# Patient Record
Sex: Female | Born: 1955 | Race: White | Hispanic: No | Marital: Married | Smoking: Never smoker
Health system: Southern US, Community
[De-identification: ages and names within clinical notes are randomized; demographics above are authoritative.]

## PROBLEM LIST (undated history)

## (undated) DIAGNOSIS — C4490 Unspecified malignant neoplasm of skin, unspecified: Secondary | ICD-10-CM

## (undated) HISTORY — PX: BASAL CELL CARCINOMA EXCISION: SHX1214

## (undated) HISTORY — PX: APPENDECTOMY: SHX54

## (undated) HISTORY — DX: Unspecified malignant neoplasm of skin, unspecified: C44.90

## (undated) HISTORY — PX: CERVICAL DISC SURGERY: SHX588

---

## 2014-07-20 ENCOUNTER — Other Ambulatory Visit: Payer: Self-pay | Admitting: Orthopaedic Surgery

## 2014-07-20 DIAGNOSIS — M48061 Spinal stenosis, lumbar region without neurogenic claudication: Secondary | ICD-10-CM

## 2014-07-24 ENCOUNTER — Ambulatory Visit
Admission: RE | Admit: 2014-07-24 | Discharge: 2014-07-24 | Disposition: A | Discharge status: Home / Self Care | Payer: BC Managed Care – PPO | Source: Ambulatory Visit | Attending: Orthopaedic Surgery | Admitting: Orthopaedic Surgery

## 2014-07-24 DIAGNOSIS — M48061 Spinal stenosis, lumbar region without neurogenic claudication: Secondary | ICD-10-CM

## 2014-08-08 ENCOUNTER — Other Ambulatory Visit: Payer: Self-pay

## 2014-11-17 ENCOUNTER — Other Ambulatory Visit: Payer: Self-pay | Admitting: Orthopaedic Surgery

## 2014-11-17 DIAGNOSIS — R102 Pelvic and perineal pain: Secondary | ICD-10-CM

## 2014-11-30 ENCOUNTER — Ambulatory Visit
Admission: RE | Admit: 2014-11-30 | Discharge: 2014-11-30 | Disposition: A | Discharge status: Home / Self Care | Payer: BLUE CROSS/BLUE SHIELD | Source: Ambulatory Visit | Attending: Orthopaedic Surgery | Admitting: Orthopaedic Surgery

## 2014-11-30 DIAGNOSIS — R102 Pelvic and perineal pain: Secondary | ICD-10-CM

## 2014-11-30 MED ORDER — GADOBENATE DIMEGLUMINE 529 MG/ML IV SOLN
13.0000 mL | Freq: Once | INTRAVENOUS | Status: AC | PRN
  Administered 2014-11-30: 13 mL via INTRAVENOUS

## 2014-12-05 ENCOUNTER — Other Ambulatory Visit (HOSPITAL_COMMUNITY): Payer: Self-pay | Admitting: Family Medicine

## 2014-12-05 DIAGNOSIS — R1903 Right lower quadrant abdominal swelling, mass and lump: Secondary | ICD-10-CM

## 2014-12-11 ENCOUNTER — Ambulatory Visit (HOSPITAL_COMMUNITY)
Admission: RE | Admit: 2014-12-11 | Discharge: 2014-12-11 | Disposition: A | Discharge status: Home / Self Care | Payer: BLUE CROSS/BLUE SHIELD | Source: Ambulatory Visit | Attending: Family Medicine | Admitting: Family Medicine

## 2014-12-11 DIAGNOSIS — R1903 Right lower quadrant abdominal swelling, mass and lump: Secondary | ICD-10-CM | POA: Diagnosis not present

## 2014-12-11 LAB — GLUCOSE, CAPILLARY: Glucose-Capillary: 89 mg/dL (ref 70–99)

## 2014-12-11 MED ORDER — FLUDEOXYGLUCOSE F - 18 (FDG) INJECTION: 7.7000 | Freq: Once | INTRAVENOUS | Status: AC | PRN

## 2015-08-06 ENCOUNTER — Other Ambulatory Visit: Payer: Self-pay | Admitting: Orthopaedic Surgery

## 2015-08-06 DIAGNOSIS — M5136 Other intervertebral disc degeneration, lumbar region: Secondary | ICD-10-CM

## 2015-08-20 ENCOUNTER — Ambulatory Visit
Admission: RE | Admit: 2015-08-20 | Discharge: 2015-08-20 | Disposition: A | Discharge status: Home / Self Care | Payer: BLUE CROSS/BLUE SHIELD | Source: Ambulatory Visit | Attending: Orthopaedic Surgery | Admitting: Orthopaedic Surgery

## 2015-08-20 DIAGNOSIS — M5136 Other intervertebral disc degeneration, lumbar region: Secondary | ICD-10-CM

## 2016-01-28 DIAGNOSIS — L570 Actinic keratosis: Secondary | ICD-10-CM | POA: Diagnosis not present

## 2016-01-28 DIAGNOSIS — Z08 Encounter for follow-up examination after completed treatment for malignant neoplasm: Secondary | ICD-10-CM | POA: Diagnosis not present

## 2016-01-28 DIAGNOSIS — Z85828 Personal history of other malignant neoplasm of skin: Secondary | ICD-10-CM | POA: Diagnosis not present

## 2016-03-18 DIAGNOSIS — Z01419 Encounter for gynecological examination (general) (routine) without abnormal findings: Secondary | ICD-10-CM | POA: Diagnosis not present

## 2016-04-23 IMAGING — MR MR LUMBAR SPINE W/O CM
4 of 5 series · 21 of 48 positions shown · non-contrast
Comparison: 12/11/2014 PET-CT. 11/30/2014 pelvic MR. 07/24/2014 and
07/15/2011 lumbar spine MR.

CLINICAL DATA: 59-year-old female with pain and numbness right
buttock and leg for the past 2 years. Subsequent encounter.

EXAM:
MRI LUMBAR SPINE WITHOUT CONTRAST
TECHNIQUE: Multiplanar, multisequence MR imaging of the lumbar spine was
performed. No intravenous contrast was administered.

[Series 6: T2 · sagittal · 4.0mm · 0.73mm/px · 7 of 15 slices shown (1 of 2)]
[im 1/15]
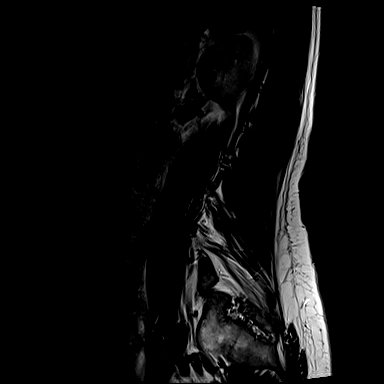
[im 3/15]
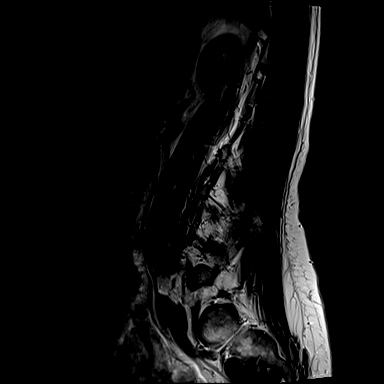
[im 5/15]
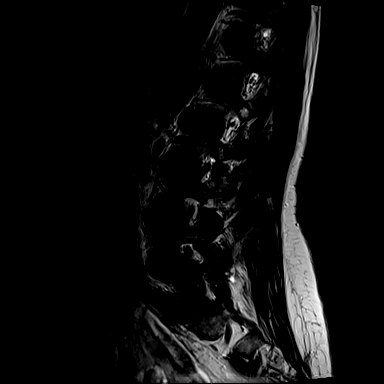
[im 8/15]
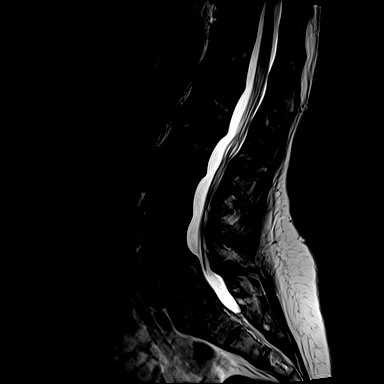
[im 10/15]
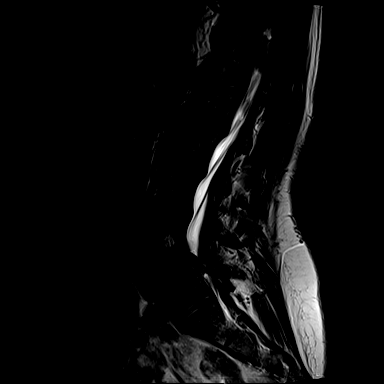
[im 12/15]
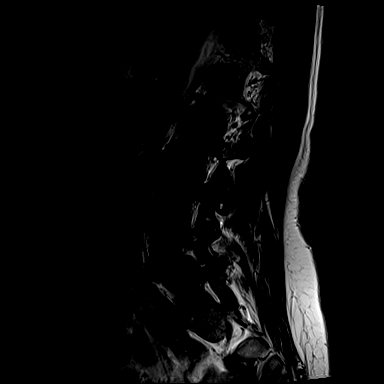
[im 15/15]
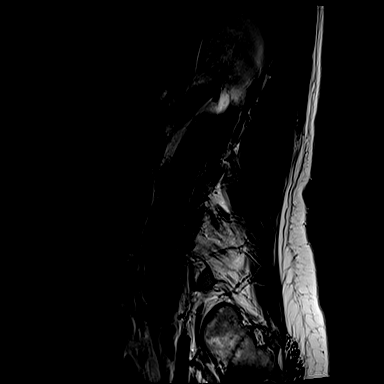

[Series 7: T1 · sagittal · 4.0mm · 0.73mm/px · 3 of 15 slices shown (1 of 2)]
[im 3/15]
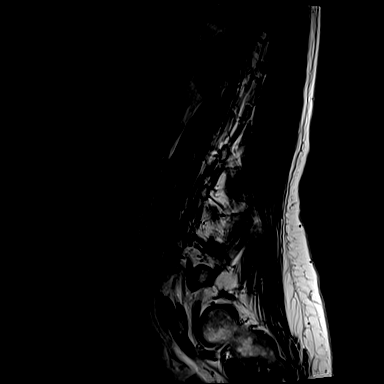
[im 8/15]
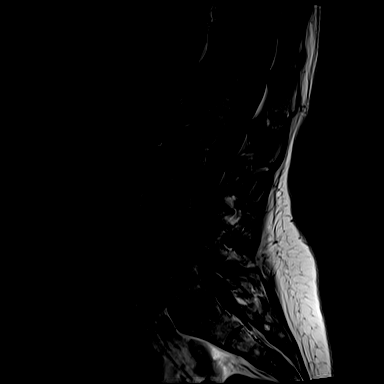
[im 12/15]
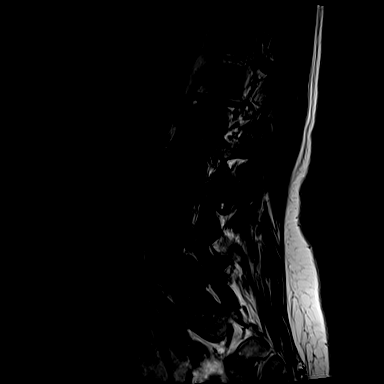

[Series 10: T2 · axial · 4.0mm · 0.28mm/px · z∈[-72,+115]mm · 8 of 33 slices shown (2 of 2)]
[im 1/33]
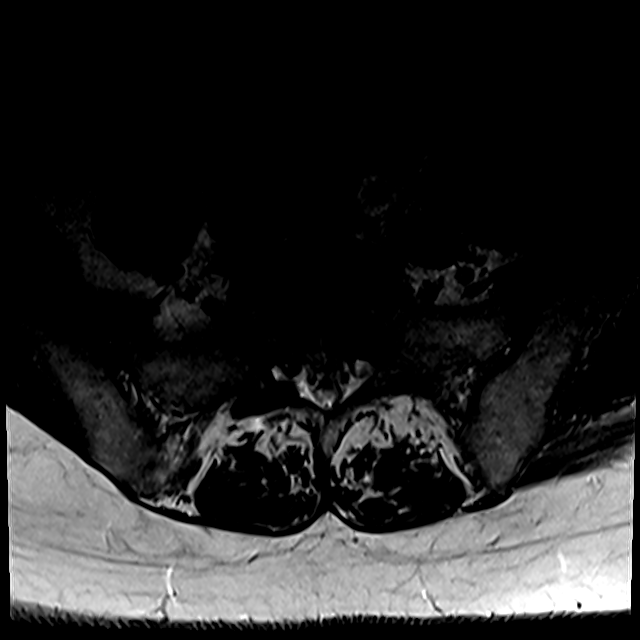
[im 5/33]
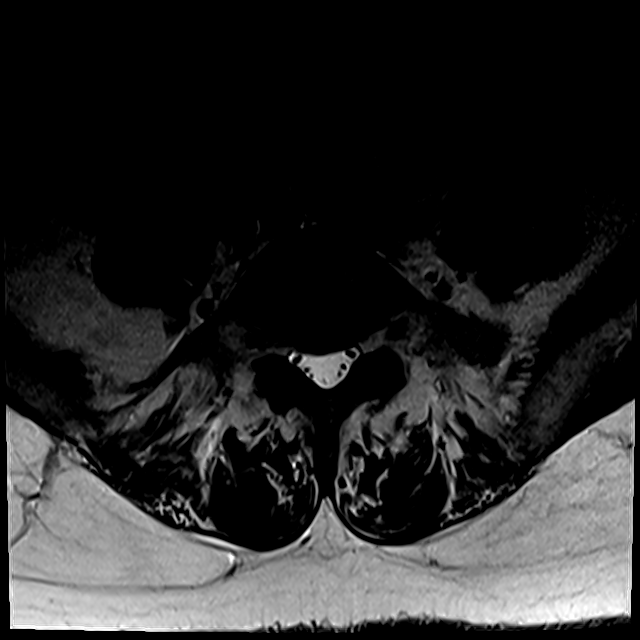
[im 10/33]
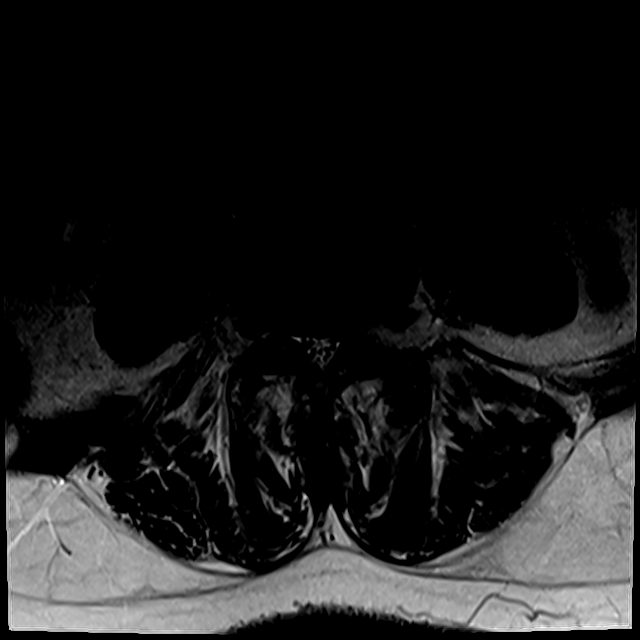
[im 15/33]
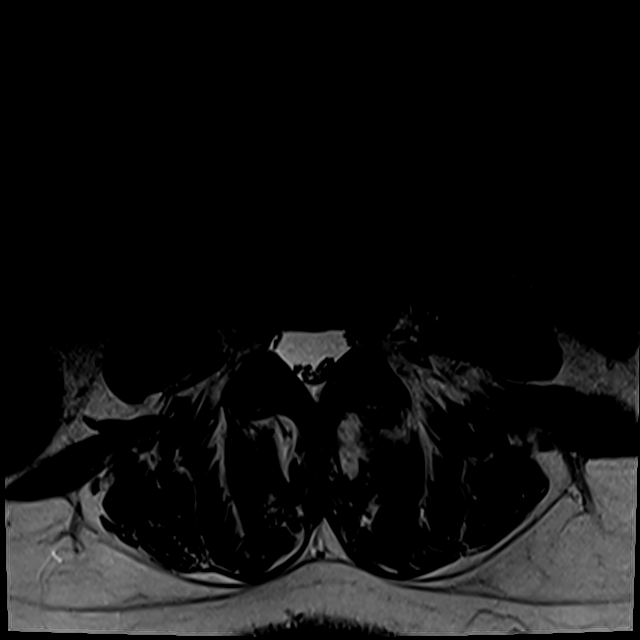
[im 18/33]
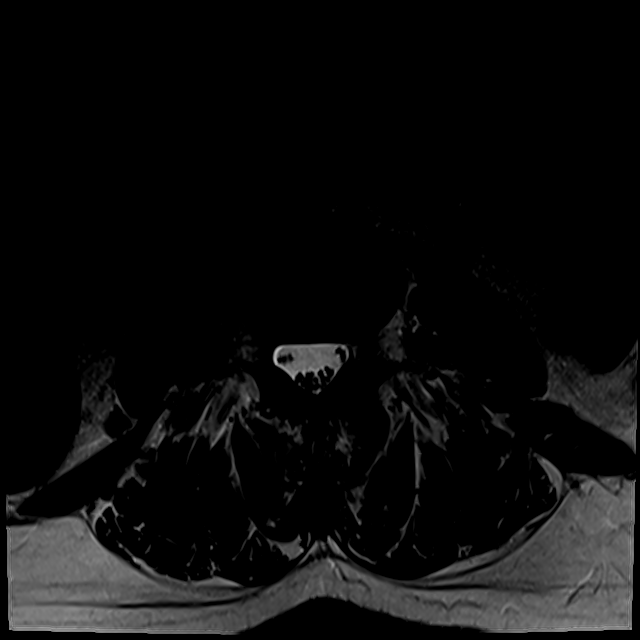
[im 23/33]
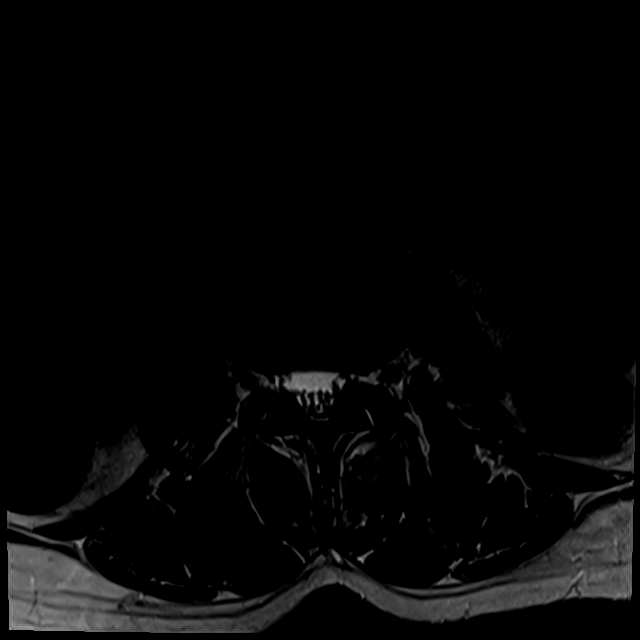
[im 28/33]
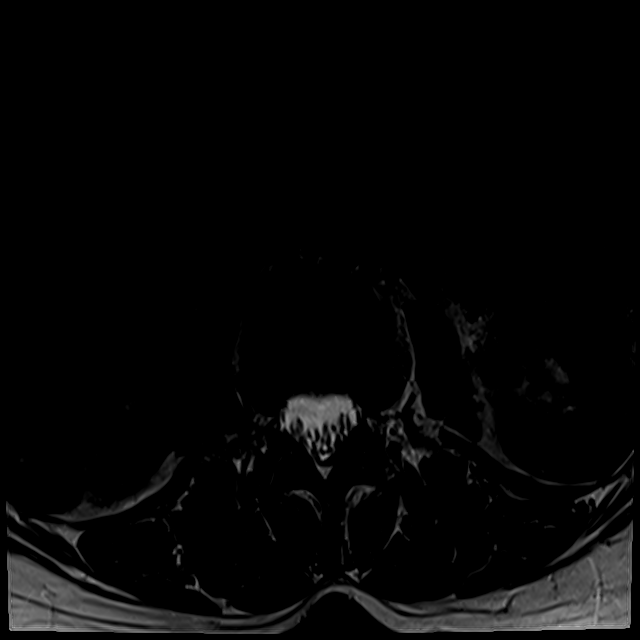
[im 33/33]
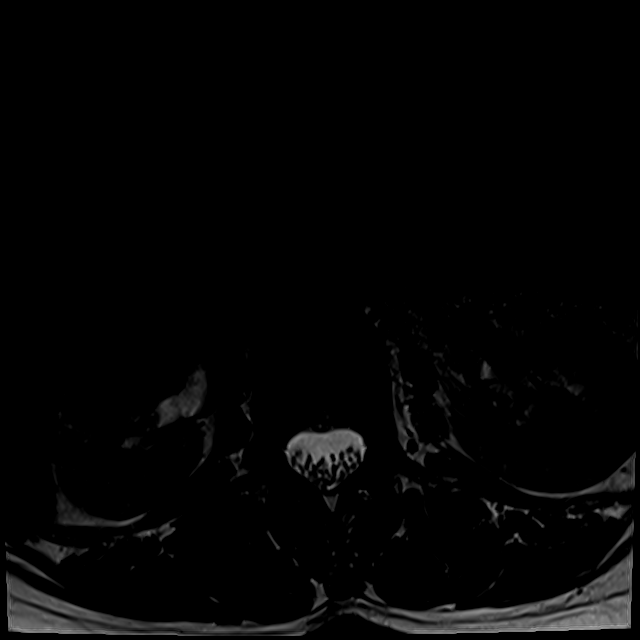

[Series 13: T1 · axial · 4.0mm · 0.56mm/px · z∈[-52,+90]mm · 3 of 33 slices shown (2 of 2)]
[im 5/33]
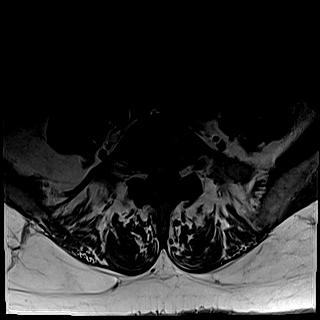
[im 18/33]
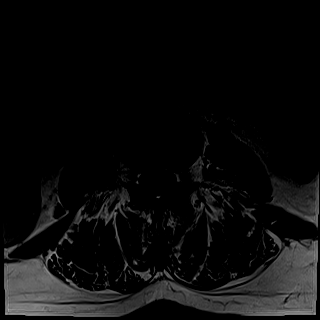
[im 28/33]
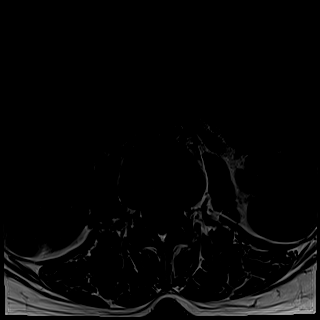

[21 of 48 positions shown; findings below may reference images not displayed]

FINDINGS: Last fully open disk space is labeled L5-S1. Present examination
incorporates from T11 through the upper S4 level. Conus lower T12
level.

S3 indeterminate osseous lesion minimally changed from pelvic MR and
07/24/2014 lumbar spine MR although new when compared to the
07/15/2011 MR. Kunze level radiotracer uptake in this region on PET-CT
and appearing lytic on CT.

1 cm rounded lesion right aspect L4 vertebral body, possibly a small
hemangioma is unchanged.

Gallstones suspected.

T11-12: Negative.

T12-L1:  Small Schmorl's node deformity.  Very mild bulge.

L1-2: Bulge slightly greater right paracentral position. Slight
indentation ventral thecal sac.

L2-3: Moderate facet joint degenerative changes. Minimal
retrolisthesis L2. Mild bulge. Very mild spinal stenosis. Minimal
left foraminal narrowing.

L3-4: Mild to moderate facet joint degenerative change greater on
left. Minimal bulge.

L4-5: Marked bilateral facet joint degenerative changes and bony
overgrowth. 6 mm anterior slip L4. Bulge with slight cephalad
extension and extension into neural foramen. Multifactorial marked
bilateral lateral recess stenosis and moderate to marked
multifactorial thecal sac narrowing. Mild bilateral foraminal
narrowing.

L5-S1: Mild to slightly moderate facet joint degenerative changes.
Slightly transitional appearance of the disc. Minimal narrowing
right lateral recess.
IMPRESSION: Slight progression of degenerative changes L4-5 level with
multifactorial marked bilateral lateral recess stenosis, moderate to
marked thecal sac narrowing and mild bilateral foraminal narrowing.

Less notable surrounding degenerative changes as detailed above.

CT of the sacrum without contrast recommended (in anticipation of
possible biopsy) to evaluate indeterminate sacral lesion which is
new when compared to the 9459 (and therefore malignancy cannot be
excluded).

These results will be called to the ordering clinician or
representative by the Radiologist Assistant, and communication
documented in the PACS or zVision Dashboard.

## 2016-07-28 DIAGNOSIS — Z85820 Personal history of malignant melanoma of skin: Secondary | ICD-10-CM | POA: Diagnosis not present

## 2016-07-28 DIAGNOSIS — Z08 Encounter for follow-up examination after completed treatment for malignant neoplasm: Secondary | ICD-10-CM | POA: Diagnosis not present

## 2016-07-28 DIAGNOSIS — Z85828 Personal history of other malignant neoplasm of skin: Secondary | ICD-10-CM | POA: Diagnosis not present

## 2016-07-28 DIAGNOSIS — L570 Actinic keratosis: Secondary | ICD-10-CM | POA: Diagnosis not present

## 2016-08-26 DIAGNOSIS — M545 Low back pain: Secondary | ICD-10-CM | POA: Diagnosis not present

## 2016-08-26 DIAGNOSIS — Z0000 Encounter for general adult medical examination without abnormal findings: Secondary | ICD-10-CM | POA: Diagnosis not present

## 2016-08-26 DIAGNOSIS — N959 Unspecified menopausal and perimenopausal disorder: Secondary | ICD-10-CM | POA: Diagnosis not present

## 2016-08-26 DIAGNOSIS — Z23 Encounter for immunization: Secondary | ICD-10-CM | POA: Diagnosis not present

## 2016-10-22 DIAGNOSIS — Z1231 Encounter for screening mammogram for malignant neoplasm of breast: Secondary | ICD-10-CM | POA: Diagnosis not present

## 2016-12-29 DIAGNOSIS — Z85828 Personal history of other malignant neoplasm of skin: Secondary | ICD-10-CM | POA: Diagnosis not present

## 2016-12-29 DIAGNOSIS — Z85820 Personal history of malignant melanoma of skin: Secondary | ICD-10-CM | POA: Diagnosis not present

## 2016-12-29 DIAGNOSIS — L570 Actinic keratosis: Secondary | ICD-10-CM | POA: Diagnosis not present

## 2016-12-29 DIAGNOSIS — Z08 Encounter for follow-up examination after completed treatment for malignant neoplasm: Secondary | ICD-10-CM | POA: Diagnosis not present

## 2017-03-19 DIAGNOSIS — Z01419 Encounter for gynecological examination (general) (routine) without abnormal findings: Secondary | ICD-10-CM | POA: Diagnosis not present

## 2017-03-19 DIAGNOSIS — Z1151 Encounter for screening for human papillomavirus (HPV): Secondary | ICD-10-CM | POA: Diagnosis not present

## 2017-07-13 DIAGNOSIS — Z85828 Personal history of other malignant neoplasm of skin: Secondary | ICD-10-CM | POA: Diagnosis not present

## 2017-07-13 DIAGNOSIS — Z85820 Personal history of malignant melanoma of skin: Secondary | ICD-10-CM | POA: Diagnosis not present

## 2017-07-13 DIAGNOSIS — L570 Actinic keratosis: Secondary | ICD-10-CM | POA: Diagnosis not present

## 2017-07-13 DIAGNOSIS — Z08 Encounter for follow-up examination after completed treatment for malignant neoplasm: Secondary | ICD-10-CM | POA: Diagnosis not present

## 2017-11-10 DIAGNOSIS — Z0000 Encounter for general adult medical examination without abnormal findings: Secondary | ICD-10-CM | POA: Diagnosis not present

## 2017-11-10 DIAGNOSIS — Z6823 Body mass index (BMI) 23.0-23.9, adult: Secondary | ICD-10-CM | POA: Diagnosis not present

## 2017-11-10 DIAGNOSIS — Z23 Encounter for immunization: Secondary | ICD-10-CM | POA: Diagnosis not present

## 2017-11-16 DIAGNOSIS — Z85820 Personal history of malignant melanoma of skin: Secondary | ICD-10-CM | POA: Diagnosis not present

## 2017-11-16 DIAGNOSIS — L570 Actinic keratosis: Secondary | ICD-10-CM | POA: Diagnosis not present

## 2017-11-16 DIAGNOSIS — Z08 Encounter for follow-up examination after completed treatment for malignant neoplasm: Secondary | ICD-10-CM | POA: Diagnosis not present

## 2017-11-16 DIAGNOSIS — Z85828 Personal history of other malignant neoplasm of skin: Secondary | ICD-10-CM | POA: Diagnosis not present

## 2017-11-17 DIAGNOSIS — M545 Low back pain: Secondary | ICD-10-CM | POA: Diagnosis not present

## 2017-11-17 DIAGNOSIS — M546 Pain in thoracic spine: Secondary | ICD-10-CM | POA: Diagnosis not present

## 2017-11-17 DIAGNOSIS — M5430 Sciatica, unspecified side: Secondary | ICD-10-CM | POA: Diagnosis not present

## 2017-11-18 DIAGNOSIS — R0789 Other chest pain: Secondary | ICD-10-CM | POA: Diagnosis not present

## 2017-11-18 DIAGNOSIS — R911 Solitary pulmonary nodule: Secondary | ICD-10-CM | POA: Diagnosis not present

## 2017-11-18 DIAGNOSIS — M62830 Muscle spasm of back: Secondary | ICD-10-CM | POA: Diagnosis not present

## 2017-11-18 DIAGNOSIS — Z6823 Body mass index (BMI) 23.0-23.9, adult: Secondary | ICD-10-CM | POA: Diagnosis not present

## 2017-11-19 DIAGNOSIS — I2510 Atherosclerotic heart disease of native coronary artery without angina pectoris: Secondary | ICD-10-CM | POA: Diagnosis not present

## 2017-11-19 DIAGNOSIS — Z981 Arthrodesis status: Secondary | ICD-10-CM | POA: Diagnosis not present

## 2017-11-19 DIAGNOSIS — R911 Solitary pulmonary nodule: Secondary | ICD-10-CM | POA: Diagnosis not present

## 2017-11-19 DIAGNOSIS — Q278 Other specified congenital malformations of peripheral vascular system: Secondary | ICD-10-CM | POA: Diagnosis not present

## 2017-11-19 DIAGNOSIS — R0789 Other chest pain: Secondary | ICD-10-CM | POA: Diagnosis not present

## 2017-11-19 DIAGNOSIS — I700 Atherosclerosis of aorta: Secondary | ICD-10-CM | POA: Diagnosis not present

## 2017-11-26 DIAGNOSIS — M542 Cervicalgia: Secondary | ICD-10-CM | POA: Diagnosis not present

## 2017-11-26 DIAGNOSIS — M4316 Spondylolisthesis, lumbar region: Secondary | ICD-10-CM | POA: Diagnosis not present

## 2017-11-26 DIAGNOSIS — M5136 Other intervertebral disc degeneration, lumbar region: Secondary | ICD-10-CM | POA: Diagnosis not present

## 2017-11-26 DIAGNOSIS — M4716 Other spondylosis with myelopathy, lumbar region: Secondary | ICD-10-CM | POA: Diagnosis not present

## 2017-12-02 DIAGNOSIS — M542 Cervicalgia: Secondary | ICD-10-CM | POA: Diagnosis not present

## 2017-12-02 DIAGNOSIS — Z6823 Body mass index (BMI) 23.0-23.9, adult: Secondary | ICD-10-CM | POA: Diagnosis not present

## 2017-12-02 DIAGNOSIS — M5412 Radiculopathy, cervical region: Secondary | ICD-10-CM | POA: Diagnosis not present

## 2017-12-02 DIAGNOSIS — N959 Unspecified menopausal and perimenopausal disorder: Secondary | ICD-10-CM | POA: Diagnosis not present

## 2017-12-03 DIAGNOSIS — K625 Hemorrhage of anus and rectum: Secondary | ICD-10-CM | POA: Diagnosis not present

## 2017-12-03 DIAGNOSIS — K5909 Other constipation: Secondary | ICD-10-CM | POA: Diagnosis not present

## 2017-12-24 DIAGNOSIS — K5900 Constipation, unspecified: Secondary | ICD-10-CM | POA: Diagnosis not present

## 2017-12-24 DIAGNOSIS — K921 Melena: Secondary | ICD-10-CM | POA: Diagnosis not present

## 2017-12-24 DIAGNOSIS — K5730 Diverticulosis of large intestine without perforation or abscess without bleeding: Secondary | ICD-10-CM | POA: Diagnosis not present

## 2017-12-24 DIAGNOSIS — K648 Other hemorrhoids: Secondary | ICD-10-CM | POA: Diagnosis not present

## 2018-03-26 DIAGNOSIS — Z79890 Hormone replacement therapy (postmenopausal): Secondary | ICD-10-CM | POA: Diagnosis not present

## 2018-03-26 DIAGNOSIS — Z01419 Encounter for gynecological examination (general) (routine) without abnormal findings: Secondary | ICD-10-CM | POA: Diagnosis not present

## 2018-06-21 DIAGNOSIS — Z08 Encounter for follow-up examination after completed treatment for malignant neoplasm: Secondary | ICD-10-CM | POA: Diagnosis not present

## 2018-06-21 DIAGNOSIS — L570 Actinic keratosis: Secondary | ICD-10-CM | POA: Diagnosis not present

## 2018-06-21 DIAGNOSIS — Z85828 Personal history of other malignant neoplasm of skin: Secondary | ICD-10-CM | POA: Diagnosis not present

## 2018-06-21 DIAGNOSIS — Z85820 Personal history of malignant melanoma of skin: Secondary | ICD-10-CM | POA: Diagnosis not present

## 2018-07-05 DIAGNOSIS — Z1231 Encounter for screening mammogram for malignant neoplasm of breast: Secondary | ICD-10-CM | POA: Diagnosis not present

## 2018-07-05 DIAGNOSIS — Z1239 Encounter for other screening for malignant neoplasm of breast: Secondary | ICD-10-CM | POA: Diagnosis not present

## 2018-07-14 DIAGNOSIS — R109 Unspecified abdominal pain: Secondary | ICD-10-CM | POA: Diagnosis not present

## 2018-07-14 DIAGNOSIS — M545 Low back pain: Secondary | ICD-10-CM | POA: Diagnosis not present

## 2018-07-14 DIAGNOSIS — Z6823 Body mass index (BMI) 23.0-23.9, adult: Secondary | ICD-10-CM | POA: Diagnosis not present

## 2018-07-15 DIAGNOSIS — M545 Low back pain: Secondary | ICD-10-CM | POA: Diagnosis not present

## 2018-07-15 DIAGNOSIS — M47816 Spondylosis without myelopathy or radiculopathy, lumbar region: Secondary | ICD-10-CM | POA: Diagnosis not present

## 2018-07-15 DIAGNOSIS — M546 Pain in thoracic spine: Secondary | ICD-10-CM | POA: Diagnosis not present

## 2018-07-15 DIAGNOSIS — Z6823 Body mass index (BMI) 23.0-23.9, adult: Secondary | ICD-10-CM | POA: Diagnosis not present

## 2018-07-15 DIAGNOSIS — M5134 Other intervertebral disc degeneration, thoracic region: Secondary | ICD-10-CM | POA: Diagnosis not present

## 2018-08-23 DIAGNOSIS — H02831 Dermatochalasis of right upper eyelid: Secondary | ICD-10-CM | POA: Diagnosis not present

## 2018-09-14 DIAGNOSIS — H0289 Other specified disorders of eyelid: Secondary | ICD-10-CM | POA: Diagnosis not present

## 2018-09-14 DIAGNOSIS — H02834 Dermatochalasis of left upper eyelid: Secondary | ICD-10-CM | POA: Diagnosis not present

## 2018-09-14 DIAGNOSIS — H02831 Dermatochalasis of right upper eyelid: Secondary | ICD-10-CM | POA: Diagnosis not present

## 2019-03-29 DIAGNOSIS — Z0000 Encounter for general adult medical examination without abnormal findings: Secondary | ICD-10-CM | POA: Diagnosis not present

## 2019-03-29 DIAGNOSIS — Z79899 Other long term (current) drug therapy: Secondary | ICD-10-CM | POA: Diagnosis not present

## 2019-03-29 DIAGNOSIS — M519 Unspecified thoracic, thoracolumbar and lumbosacral intervertebral disc disorder: Secondary | ICD-10-CM | POA: Diagnosis not present

## 2019-03-29 DIAGNOSIS — K5904 Chronic idiopathic constipation: Secondary | ICD-10-CM | POA: Diagnosis not present

## 2019-03-29 DIAGNOSIS — M5090 Cervical disc disorder, unspecified, unspecified cervical region: Secondary | ICD-10-CM | POA: Diagnosis not present

## 2019-03-30 DIAGNOSIS — Z01419 Encounter for gynecological examination (general) (routine) without abnormal findings: Secondary | ICD-10-CM | POA: Diagnosis not present

## 2019-03-30 DIAGNOSIS — Z79890 Hormone replacement therapy (postmenopausal): Secondary | ICD-10-CM | POA: Diagnosis not present

## 2019-04-07 DIAGNOSIS — Z20828 Contact with and (suspected) exposure to other viral communicable diseases: Secondary | ICD-10-CM | POA: Diagnosis not present

## 2019-04-07 DIAGNOSIS — B349 Viral infection, unspecified: Secondary | ICD-10-CM | POA: Diagnosis not present

## 2019-04-07 DIAGNOSIS — R51 Headache: Secondary | ICD-10-CM | POA: Diagnosis not present

## 2019-04-07 DIAGNOSIS — R0981 Nasal congestion: Secondary | ICD-10-CM | POA: Diagnosis not present

## 2019-04-07 DIAGNOSIS — J3489 Other specified disorders of nose and nasal sinuses: Secondary | ICD-10-CM | POA: Diagnosis not present

## 2019-04-07 DIAGNOSIS — R197 Diarrhea, unspecified: Secondary | ICD-10-CM | POA: Diagnosis not present

## 2019-04-25 DIAGNOSIS — Z85820 Personal history of malignant melanoma of skin: Secondary | ICD-10-CM | POA: Diagnosis not present

## 2019-04-25 DIAGNOSIS — Z85828 Personal history of other malignant neoplasm of skin: Secondary | ICD-10-CM | POA: Diagnosis not present

## 2019-04-25 DIAGNOSIS — Z08 Encounter for follow-up examination after completed treatment for malignant neoplasm: Secondary | ICD-10-CM | POA: Diagnosis not present

## 2019-05-10 DIAGNOSIS — R3129 Other microscopic hematuria: Secondary | ICD-10-CM | POA: Diagnosis not present

## 2019-08-15 DIAGNOSIS — Z1231 Encounter for screening mammogram for malignant neoplasm of breast: Secondary | ICD-10-CM | POA: Diagnosis not present

## 2021-10-29 ENCOUNTER — Other Ambulatory Visit: Payer: Self-pay

## 2021-10-29 ENCOUNTER — Encounter: Payer: Self-pay | Admitting: Allergy

## 2021-10-29 ENCOUNTER — Ambulatory Visit (INDEPENDENT_AMBULATORY_CARE_PROVIDER_SITE_OTHER): Payer: Medicare Other | Admitting: Allergy

## 2021-10-29 VITALS — BP 148/70 | HR 100 | Resp 16 | Ht 65.0 in | Wt 150.8 lb

## 2021-10-29 DIAGNOSIS — L309 Dermatitis, unspecified: Secondary | ICD-10-CM

## 2021-10-29 DIAGNOSIS — L508 Other urticaria: Secondary | ICD-10-CM

## 2021-10-29 DIAGNOSIS — L299 Pruritus, unspecified: Secondary | ICD-10-CM | POA: Diagnosis not present

## 2021-10-29 NOTE — Patient Instructions (Addendum)
° -   at this time etiology of rash and itching is unknown.  Hives can be caused by a variety of different triggers including illness/infection, foods, medications, stings, exercise, pressure, vibrations, extremes of temperature to name a few however majority of the time there is no identifiable trigger.   It is possible that your vaccine may have triggered an immune response that activated your allergy cells driving recurrent and chronic itching and rash -Your symptoms have been ongoing for >6 weeks making this chronic thus will obtain labwork to evaluate: CBC w diff, CMP, tryptase, hive panel, environmental panel, alpha-gal panel, inflammatory markers -for management recommend high-dose antihistamine regimen: Zyrtec or Allegra 1 tab twice a day with Pepcid 1 tab twice a day -if above regimen is not enough then your Zyrtec or Allegra can be increased with max dosing of 4 tabs per day -if still not effective enough let us know and will add in Singulair therapy.   If this is not enough to control symptoms then would recommend starting Xolair monthly injections for improved control.  Will discuss this in more detail in future if needed -should significant symptoms recur or new symptoms occur, a journal is to be kept recording any foods eaten, beverages consumed, medications taken, activities performed, and environmental conditions within a 6 hour time period prior to the onset of symptoms.   Follow-up in 3-4 months or sooner if needed

## 2021-10-29 NOTE — Progress Notes (Signed)
New Patient Note  RE: April Horne MRN: [id number] DOB: 1955/12/07 Date of Office Visit: 10/29/2021  Referring provider: Gweneth Fritter, FNP Primary care provider: Raina Mina., MD  Chief Complaint: rash, itching  History of present illness: April Horne is a 66 y.o. female presenting today for evaluation of dermatitis and pruritus.  She started having a rash that started on her left arm and states it moved to her right arm and then legs and back.   She states in August 2022 the rash started on her left arm around where she was wearing her watch that had a little round metal piece on the inside.  She thought she was reacting to the arm band.  She stopped using this device and she is continue to have the rash and itch.  She is also concerned that it could be related to the 4th covid booster.  She states after her 2nd shot she did develop "blister" above her eye.  She states the 3rd dose booster she did fine.  After the 12 boosters when she started having these symptoms. She states the rash moves around her body.  It is very itchy.  she has had daily symptoms.  No swelling, no joint aches/pains.  No fevers.  No preceding illnesses.  No change in medications or foods.  No change in detergents/soaps/lotions/body products.  No bites/stings.  she does states she has had scarring but believes its related to scratching.   No household members with similar symptoms.   She has received a Kenalog injection for the itching at her PCP visit on 10/08/2021. She also states she has had prednisone course.  She did not find the steroids very helpful.  She is using cetaphil lotion and products.  She has taken zyrtec daily for this and did not find it helpful.  She will use benadryl as needed.   She does believe in 2021 she had a tick bite.  She does eat red meat in diet but not that often.   She has history of skin cancer and does follow with dermatology however she has not been to them for this  current concern.    Review of systems: Review of Systems  Constitutional: Negative.   HENT: Negative.    Eyes: Negative.   Respiratory: Negative.    Cardiovascular: Negative.   Gastrointestinal: Negative.   Musculoskeletal: Negative.   Skin:        See HPI  Allergic/Immunologic: Negative.   Neurological: Negative.    All other systems negative unless noted above in HPI  Past medical history: Past Medical History:  Diagnosis Date   basal cell carcinoma     Past surgical history: Past Surgical History:  Procedure Laterality Date   APPENDECTOMY     BASAL CELL CARCINOMA EXCISION     CERVICAL DISC SURGERY      Family history:  Family History  Problem Relation Age of Onset   Alopecia Father    Hypertension Father    Heart disease Father    Parkinson's disease Father     Social history: Lives in a house as well as a condo when at [employer].  House without carpeting with gas and heat pump heating and heat pump cooling.  No pets in the home.  1 dog and 2 cats outside the home.  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  She has no  smoking history.     Medication List: Current Outpatient Medications  Medication Sig Dispense Refill   COLLAGEN PO Take by mouth.     CRANBERRY PO Take by mouth.     cyanocobalamin 1000 MCG tablet Take 1 tablet by mouth daily.     cyclobenzaprine (FLEXERIL) 10 MG tablet Take 5-10 mg by mouth 3 (three) times daily as needed.     medroxyPROGESTERone (PROVERA) 10 MG tablet Take 10 mg by mouth daily.     Norethin Ace-Eth Estrad-FE 1-20 MG-MCG(24) CHEW Chew by mouth.     Nutritional Supplements (JUICE PLUS FIBRE PO) Take by mouth.     polyethylene glycol powder (GLYCOLAX/MIRALAX) 17 GM/SCOOP powder Take by mouth.     Probiotic Product (PROBIOTIC PO) Take by mouth.     Turmeric Curcumin 500 MG CAPS Take by mouth.     No current facility-administered medications for this visit.     Known medication allergies: No Known Allergies   Physical examination: Blood pressure (!) 148/70, pulse 100, resp. rate 16, height 5\' 5"  (1.651 m), weight 150 lb 12.8 oz (68.4 kg), SpO2 98 %.  General: Alert, interactive, in no acute distress. HEENT: PERRLA, TMs pearly gray, turbinates non-edematous without discharge, post-pharynx non erythematous. Neck: Supple without lymphadenopathy. Lungs: Clear to auscultation without wheezing, rhonchi or rales. {no increased work of breathing. CV: Normal S1, S2 without murmurs. Abdomen: Nondistended, nontender. Skin: Scattered erythematous urticarial type lesions primarily located back, b/l forearms , nonvesicular. Extremities:  No clubbing, cyanosis or edema. Neuro:   Grossly intact.  Diagnositics/Labs:  Allergy testing: Deferred due to ongoing rash  Assessment and plan: Chronic pruritus, chronic urticaria    - at this time etiology of rash and itching is unknown.  Hives can be caused by a variety of different triggers including illness/infection, foods, medications, stings, exercise, pressure, vibrations, extremes of temperature to name a few however majority of the time there is no identifiable trigger.   It is possible that your vaccine may have triggered an immune response that activated your allergy cells driving recurrent and chronic itching and rash -Your symptoms have been ongoing for >6 weeks making this chronic thus will obtain labwork to evaluate: CBC w diff, CMP, tryptase, hive panel, environmental panel, alpha-gal panel, inflammatory markers -for management recommend high-dose antihistamine regimen: Zyrtec or Allegra 1 tab twice a day with Pepcid 1 tab twice a day -if above regimen is not enough then your Zyrtec or Allegra can be increased with max dosing of 4 tabs per day -if still not effective enough let us know and will add in Singulair therapy.   If this is not enough to control symptoms then would recommend starting Xolair  monthly injections for improved control.  Will discuss this in more detail in future if needed -should significant symptoms recur or new symptoms occur, a journal is to be kept recording any foods eaten, beverages consumed, medications taken, activities performed, and environmental conditions within a 6 hour time period prior to the onset of symptoms.   Follow-up in 3-4 months or sooner if needed  I appreciate the opportunity to take part in Ireene's care. Please do not hesitate to contact me with questions.  Sincerely,   Prudy Feeler, MD Allergy/Immunology [facility]

## 2021-11-07 ENCOUNTER — Ambulatory Visit: Payer: Medicare Other | Admitting: Allergy and Immunology

## 2021-11-07 LAB — TRYPTASE: Tryptase: 2.9 ug/L (ref 2.2–13.2)

## 2021-11-07 LAB — ALLERGENS W/TOTAL IGE AREA 2

## 2021-11-07 LAB — COMPREHENSIVE METABOLIC PANEL
ALT: 16 IU/L (ref 0–32)
AST: 20 IU/L (ref 0–40)
Albumin/Globulin Ratio: 2.4 — ABNORMAL HIGH (ref 1.2–2.2)
Albumin: 5.2 g/dL — ABNORMAL HIGH (ref 3.8–4.8)
Alkaline Phosphatase: 53 IU/L (ref 44–121)
BUN/Creatinine Ratio: 17 (ref 12–28)
BUN: 11 mg/dL (ref 8–27)
Bilirubin Total: 1.2 mg/dL (ref 0.0–1.2)
CO2: 22 mmol/L (ref 20–29)
Calcium: 9.9 mg/dL (ref 8.7–10.3)
Chloride: 102 mmol/L (ref 96–106)
Creatinine, Ser: 0.64 mg/dL (ref 0.57–1.00)
Globulin, Total: 2.2 g/dL (ref 1.5–4.5)
Glucose: 101 mg/dL — ABNORMAL HIGH (ref 70–99)
Potassium: 4.6 mmol/L (ref 3.5–5.2)
Sodium: 141 mmol/L (ref 134–144)
Total Protein: 7.4 g/dL (ref 6.0–8.5)
eGFR: 98 mL/min/{1.73_m2} (ref >59)

## 2021-11-07 LAB — ALPHA-GAL PANEL
Allergen Lamb IgE: <0.1 kU/L
Beef IgE: <0.1 kU/L
IgE (Immunoglobulin E), Serum: 12 IU/mL (ref 6–495)
O215-IgE Alpha-Gal: <0.1 kU/L
Pork IgE: <0.1 kU/L

## 2021-11-07 LAB — CBC WITH DIFFERENTIAL/PLATELET
Basophils Absolute: 0.1 10*3/uL (ref 0.0–0.2)
Basos: 1 %
EOS (ABSOLUTE): 0.2 10*3/uL (ref 0.0–0.4)
Eos: 2 %
Hematocrit: 43.2 % (ref 34.0–46.6)
Hemoglobin: 14.6 g/dL (ref 11.1–15.9)
Immature Grans (Abs): 0 10*3/uL (ref 0.0–0.1)
Immature Granulocytes: 0 %
Lymphocytes Absolute: 1.3 10*3/uL (ref 0.7–3.1)
Lymphs: 16 %
MCH: 32.1 pg (ref 26.6–33.0)
MCHC: 33.8 g/dL (ref 31.5–35.7)
MCV: 95 fL (ref 79–97)
Monocytes Absolute: 0.7 10*3/uL (ref 0.1–0.9)
Monocytes: 10 %
Neutrophils Absolute: 5.5 10*3/uL (ref 1.4–7.0)
Neutrophils: 71 %
Platelets: 313 10*3/uL (ref 150–450)
RBC: 4.55 x10E6/uL (ref 3.77–5.28)
RDW: 12.5 % (ref 11.7–15.4)
WBC: 7.7 10*3/uL (ref 3.4–10.8)

## 2021-11-07 LAB — TSH+FREE T4
Free T4: 1.37 ng/dL (ref 0.82–1.77)
TSH: 2.88 u[IU]/mL (ref 0.450–4.500)

## 2021-11-07 LAB — SEDIMENTATION RATE: Sed Rate: 2 mm/hr (ref 0–40)

## 2021-11-07 LAB — THYROID ANTIBODIES
Thyroglobulin Antibody: <1 IU/mL (ref 0.0–0.9)
Thyroperoxidase Ab SerPl-aCnc: 241 IU/mL — ABNORMAL HIGH (ref 0–34)

## 2021-11-07 LAB — CHRONIC URTICARIA: cu index: 6.1 (ref <10)

## 2021-11-07 LAB — ANA W/REFLEX IF POSITIVE: Anti Nuclear Antibody (ANA): NEGATIVE

## 2021-11-11 ENCOUNTER — Telehealth: Payer: Self-pay

## 2021-11-11 NOTE — Telephone Encounter (Signed)
Patient is still having hives, but is better.  She is taking the Pepcid and Zyrtec twice daily and has added in Lysine.  I encouraged her to try taking the Zyrtec two tablet twice daily / max of four tablets daily per Dr. Jeralyn Ruths note, to see if this helps with hive control.  Patient agreed with plan and will let us know if it is not helpful.

## 2021-12-16 ENCOUNTER — Telehealth: Payer: Self-pay | Admitting: *Deleted

## 2021-12-16 NOTE — Telephone Encounter (Signed)
Patient called and advised that herr itching and bumps on her legs have become unbearable and wants to move forward with Xolair for her Hives. Due to her ins [facility] and supplement I advised her we would buy and bill her ins so $0 out of pocket to patient and scheduled appt this Thursday to start therapy ?

## 2021-12-18 DIAGNOSIS — L501 Idiopathic urticaria: Secondary | ICD-10-CM

## 2021-12-19 ENCOUNTER — Ambulatory Visit (INDEPENDENT_AMBULATORY_CARE_PROVIDER_SITE_OTHER): Payer: Medicare Other | Admitting: *Deleted

## 2021-12-19 ENCOUNTER — Other Ambulatory Visit: Payer: Self-pay

## 2021-12-19 DIAGNOSIS — L501 Idiopathic urticaria: Secondary | ICD-10-CM

## 2021-12-19 MED ORDER — EPINEPHRINE 0.3 MG/0.3ML IJ SOAJ: INTRAMUSCULAR | 2 refills | Status: AC

## 2021-12-19 MED ORDER — OMALIZUMAB 150 MG/ML ~~LOC~~ SOSY
300.0000 mg | PREFILLED_SYRINGE | SUBCUTANEOUS | Status: AC
  Administered 2021-12-19 – 2024-10-17 (×37): 300 mg via SUBCUTANEOUS

## 2022-01-15 DIAGNOSIS — L501 Idiopathic urticaria: Secondary | ICD-10-CM | POA: Diagnosis not present

## 2022-01-16 ENCOUNTER — Ambulatory Visit (INDEPENDENT_AMBULATORY_CARE_PROVIDER_SITE_OTHER): Payer: Medicare Other | Admitting: *Deleted

## 2022-01-16 DIAGNOSIS — L501 Idiopathic urticaria: Secondary | ICD-10-CM

## 2022-02-12 ENCOUNTER — Ambulatory Visit (INDEPENDENT_AMBULATORY_CARE_PROVIDER_SITE_OTHER): Payer: Medicare Other | Admitting: *Deleted

## 2022-02-12 DIAGNOSIS — L501 Idiopathic urticaria: Secondary | ICD-10-CM

## 2022-02-13 ENCOUNTER — Ambulatory Visit: Payer: Medicare Other

## 2022-03-10 DIAGNOSIS — L501 Idiopathic urticaria: Secondary | ICD-10-CM

## 2022-03-11 ENCOUNTER — Ambulatory Visit (INDEPENDENT_AMBULATORY_CARE_PROVIDER_SITE_OTHER): Payer: Medicare Other | Admitting: *Deleted

## 2022-03-11 DIAGNOSIS — L501 Idiopathic urticaria: Secondary | ICD-10-CM | POA: Diagnosis not present

## 2022-03-12 ENCOUNTER — Ambulatory Visit: Payer: Medicare Other

## 2022-04-07 DIAGNOSIS — L501 Idiopathic urticaria: Secondary | ICD-10-CM | POA: Diagnosis not present

## 2022-04-08 ENCOUNTER — Ambulatory Visit (INDEPENDENT_AMBULATORY_CARE_PROVIDER_SITE_OTHER): Payer: Medicare Other | Admitting: *Deleted

## 2022-04-08 DIAGNOSIS — L501 Idiopathic urticaria: Secondary | ICD-10-CM | POA: Diagnosis not present

## 2022-05-05 DIAGNOSIS — L501 Idiopathic urticaria: Secondary | ICD-10-CM | POA: Diagnosis not present

## 2022-05-06 ENCOUNTER — Ambulatory Visit (INDEPENDENT_AMBULATORY_CARE_PROVIDER_SITE_OTHER): Payer: Medicare Other | Admitting: *Deleted

## 2022-05-06 DIAGNOSIS — L501 Idiopathic urticaria: Secondary | ICD-10-CM | POA: Diagnosis not present

## 2022-06-03 DIAGNOSIS — L501 Idiopathic urticaria: Secondary | ICD-10-CM

## 2022-06-04 ENCOUNTER — Ambulatory Visit (INDEPENDENT_AMBULATORY_CARE_PROVIDER_SITE_OTHER): Payer: Medicare Other | Admitting: *Deleted

## 2022-06-04 DIAGNOSIS — L501 Idiopathic urticaria: Secondary | ICD-10-CM

## 2022-06-27 DIAGNOSIS — L501 Idiopathic urticaria: Secondary | ICD-10-CM | POA: Diagnosis not present

## 2022-06-30 ENCOUNTER — Ambulatory Visit (INDEPENDENT_AMBULATORY_CARE_PROVIDER_SITE_OTHER): Payer: Medicare Other | Admitting: *Deleted

## 2022-06-30 DIAGNOSIS — L501 Idiopathic urticaria: Secondary | ICD-10-CM | POA: Diagnosis not present

## 2022-07-02 ENCOUNTER — Ambulatory Visit: Payer: Medicare Other

## 2022-07-25 DIAGNOSIS — L501 Idiopathic urticaria: Secondary | ICD-10-CM | POA: Diagnosis not present

## 2022-07-28 ENCOUNTER — Ambulatory Visit (INDEPENDENT_AMBULATORY_CARE_PROVIDER_SITE_OTHER): Payer: Medicare Other | Admitting: *Deleted

## 2022-07-28 DIAGNOSIS — L501 Idiopathic urticaria: Secondary | ICD-10-CM | POA: Diagnosis not present

## 2022-08-20 DIAGNOSIS — L501 Idiopathic urticaria: Secondary | ICD-10-CM

## 2022-08-25 ENCOUNTER — Ambulatory Visit (INDEPENDENT_AMBULATORY_CARE_PROVIDER_SITE_OTHER): Payer: Medicare Other | Admitting: *Deleted

## 2022-08-25 DIAGNOSIS — L501 Idiopathic urticaria: Secondary | ICD-10-CM | POA: Diagnosis not present

## 2022-09-24 ENCOUNTER — Ambulatory Visit (INDEPENDENT_AMBULATORY_CARE_PROVIDER_SITE_OTHER): Payer: Medicare Other | Admitting: *Deleted

## 2022-09-24 DIAGNOSIS — L501 Idiopathic urticaria: Secondary | ICD-10-CM

## 2022-10-20 ENCOUNTER — Ambulatory Visit (INDEPENDENT_AMBULATORY_CARE_PROVIDER_SITE_OTHER): Payer: Medicare Other | Admitting: *Deleted

## 2022-10-20 DIAGNOSIS — L501 Idiopathic urticaria: Secondary | ICD-10-CM

## 2022-10-22 ENCOUNTER — Ambulatory Visit: Payer: Medicare Other

## 2022-11-17 ENCOUNTER — Ambulatory Visit: Payer: Medicare Other

## 2022-11-18 ENCOUNTER — Ambulatory Visit (INDEPENDENT_AMBULATORY_CARE_PROVIDER_SITE_OTHER): Payer: Medicare Other

## 2022-11-18 DIAGNOSIS — L501 Idiopathic urticaria: Secondary | ICD-10-CM | POA: Diagnosis not present

## 2022-12-16 ENCOUNTER — Ambulatory Visit (INDEPENDENT_AMBULATORY_CARE_PROVIDER_SITE_OTHER): Payer: Medicare Other

## 2022-12-16 DIAGNOSIS — L501 Idiopathic urticaria: Secondary | ICD-10-CM

## 2023-01-13 ENCOUNTER — Ambulatory Visit (INDEPENDENT_AMBULATORY_CARE_PROVIDER_SITE_OTHER): Payer: Medicare Other | Admitting: *Deleted

## 2023-01-13 DIAGNOSIS — L501 Idiopathic urticaria: Secondary | ICD-10-CM

## 2023-02-10 ENCOUNTER — Ambulatory Visit (INDEPENDENT_AMBULATORY_CARE_PROVIDER_SITE_OTHER): Payer: Medicare Other | Admitting: *Deleted

## 2023-02-10 DIAGNOSIS — L501 Idiopathic urticaria: Secondary | ICD-10-CM

## 2023-03-10 ENCOUNTER — Ambulatory Visit (INDEPENDENT_AMBULATORY_CARE_PROVIDER_SITE_OTHER): Payer: Medicare Other

## 2023-03-10 DIAGNOSIS — L501 Idiopathic urticaria: Secondary | ICD-10-CM | POA: Diagnosis not present

## 2023-04-07 ENCOUNTER — Ambulatory Visit (INDEPENDENT_AMBULATORY_CARE_PROVIDER_SITE_OTHER): Payer: Medicare Other | Admitting: *Deleted

## 2023-04-07 DIAGNOSIS — L501 Idiopathic urticaria: Secondary | ICD-10-CM | POA: Diagnosis not present

## 2023-05-05 ENCOUNTER — Ambulatory Visit (INDEPENDENT_AMBULATORY_CARE_PROVIDER_SITE_OTHER): Payer: Medicare Other | Admitting: *Deleted

## 2023-05-05 DIAGNOSIS — L501 Idiopathic urticaria: Secondary | ICD-10-CM

## 2023-06-02 ENCOUNTER — Ambulatory Visit (INDEPENDENT_AMBULATORY_CARE_PROVIDER_SITE_OTHER): Payer: Medicare Other | Admitting: *Deleted

## 2023-06-02 DIAGNOSIS — L501 Idiopathic urticaria: Secondary | ICD-10-CM | POA: Diagnosis not present

## 2023-06-29 ENCOUNTER — Ambulatory Visit (INDEPENDENT_AMBULATORY_CARE_PROVIDER_SITE_OTHER): Payer: Medicare Other

## 2023-06-29 ENCOUNTER — Ambulatory Visit: Payer: Medicare Other

## 2023-06-29 DIAGNOSIS — L501 Idiopathic urticaria: Secondary | ICD-10-CM

## 2023-06-30 ENCOUNTER — Ambulatory Visit: Payer: Medicare Other

## 2023-07-27 ENCOUNTER — Ambulatory Visit (INDEPENDENT_AMBULATORY_CARE_PROVIDER_SITE_OTHER): Payer: Medicare Other | Admitting: *Deleted

## 2023-07-27 DIAGNOSIS — L501 Idiopathic urticaria: Secondary | ICD-10-CM | POA: Diagnosis not present

## 2023-08-24 ENCOUNTER — Ambulatory Visit (INDEPENDENT_AMBULATORY_CARE_PROVIDER_SITE_OTHER): Payer: Medicare Other | Admitting: *Deleted

## 2023-08-24 DIAGNOSIS — L501 Idiopathic urticaria: Secondary | ICD-10-CM

## 2023-09-21 ENCOUNTER — Ambulatory Visit (INDEPENDENT_AMBULATORY_CARE_PROVIDER_SITE_OTHER): Payer: Medicare Other | Admitting: *Deleted

## 2023-09-21 DIAGNOSIS — L501 Idiopathic urticaria: Secondary | ICD-10-CM | POA: Diagnosis not present

## 2023-10-19 ENCOUNTER — Ambulatory Visit (INDEPENDENT_AMBULATORY_CARE_PROVIDER_SITE_OTHER): Payer: Medicare Other | Admitting: *Deleted

## 2023-10-19 DIAGNOSIS — L501 Idiopathic urticaria: Secondary | ICD-10-CM | POA: Diagnosis not present

## 2023-11-16 ENCOUNTER — Ambulatory Visit: Payer: Medicare Other

## 2023-11-16 DIAGNOSIS — L501 Idiopathic urticaria: Secondary | ICD-10-CM | POA: Diagnosis not present

## 2023-12-14 ENCOUNTER — Ambulatory Visit: Payer: Medicare Other

## 2023-12-14 DIAGNOSIS — L501 Idiopathic urticaria: Secondary | ICD-10-CM

## 2024-01-11 ENCOUNTER — Ambulatory Visit

## 2024-01-12 ENCOUNTER — Ambulatory Visit

## 2024-01-12 DIAGNOSIS — L501 Idiopathic urticaria: Secondary | ICD-10-CM

## 2024-02-09 ENCOUNTER — Ambulatory Visit

## 2024-02-09 DIAGNOSIS — L501 Idiopathic urticaria: Secondary | ICD-10-CM

## 2024-03-08 ENCOUNTER — Ambulatory Visit

## 2024-03-08 DIAGNOSIS — L501 Idiopathic urticaria: Secondary | ICD-10-CM

## 2024-04-05 ENCOUNTER — Ambulatory Visit

## 2024-04-05 DIAGNOSIS — L501 Idiopathic urticaria: Secondary | ICD-10-CM | POA: Diagnosis not present

## 2024-05-02 ENCOUNTER — Ambulatory Visit (INDEPENDENT_AMBULATORY_CARE_PROVIDER_SITE_OTHER): Admitting: *Deleted

## 2024-05-02 DIAGNOSIS — L501 Idiopathic urticaria: Secondary | ICD-10-CM

## 2024-05-03 ENCOUNTER — Ambulatory Visit

## 2024-05-31 ENCOUNTER — Ambulatory Visit

## 2024-06-01 ENCOUNTER — Ambulatory Visit (INDEPENDENT_AMBULATORY_CARE_PROVIDER_SITE_OTHER)

## 2024-06-01 DIAGNOSIS — L501 Idiopathic urticaria: Secondary | ICD-10-CM

## 2024-06-29 ENCOUNTER — Ambulatory Visit (INDEPENDENT_AMBULATORY_CARE_PROVIDER_SITE_OTHER)

## 2024-06-29 DIAGNOSIS — L501 Idiopathic urticaria: Secondary | ICD-10-CM | POA: Diagnosis not present

## 2024-07-27 ENCOUNTER — Ambulatory Visit (INDEPENDENT_AMBULATORY_CARE_PROVIDER_SITE_OTHER)

## 2024-07-27 DIAGNOSIS — L501 Idiopathic urticaria: Secondary | ICD-10-CM

## 2024-08-23 ENCOUNTER — Ambulatory Visit (INDEPENDENT_AMBULATORY_CARE_PROVIDER_SITE_OTHER): Admitting: *Deleted

## 2024-08-23 DIAGNOSIS — L501 Idiopathic urticaria: Secondary | ICD-10-CM | POA: Diagnosis not present

## 2024-08-23 MED ORDER — OMALIZUMAB 300 MG/2  ML ~~LOC~~ SOSY
300.0000 mg | PREFILLED_SYRINGE | Freq: Once | SUBCUTANEOUS | Status: AC
  Administered 2024-08-23: 300 mg via SUBCUTANEOUS

## 2024-08-24 ENCOUNTER — Ambulatory Visit

## 2024-09-19 ENCOUNTER — Ambulatory Visit: Admitting: *Deleted

## 2024-09-19 DIAGNOSIS — L501 Idiopathic urticaria: Secondary | ICD-10-CM | POA: Diagnosis not present

## 2024-09-20 ENCOUNTER — Ambulatory Visit

## 2024-10-17 ENCOUNTER — Ambulatory Visit: Admitting: *Deleted

## 2024-10-17 DIAGNOSIS — L501 Idiopathic urticaria: Secondary | ICD-10-CM

## 2024-11-14 ENCOUNTER — Ambulatory Visit
